# Patient Record
Sex: Female | Born: 2000 | Race: Black or African American | Hispanic: No | Marital: Single | Smoking: Never smoker
Health system: Southern US, Community
[De-identification: ages and names within clinical notes are randomized; demographics above are authoritative.]

---
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
## 2020-12-10 ENCOUNTER — Other Ambulatory Visit: Payer: Self-pay

## 2020-12-10 ENCOUNTER — Emergency Department (HOSPITAL_COMMUNITY): Payer: Medicaid Other

## 2020-12-10 ENCOUNTER — Encounter (HOSPITAL_COMMUNITY): Payer: Self-pay

## 2020-12-10 ENCOUNTER — Emergency Department (HOSPITAL_COMMUNITY)
Admission: EM | Admit: 2020-12-10 | Discharge: 2020-12-11 | Disposition: A | Discharge status: Home / Self Care | Payer: Medicaid Other | Attending: Emergency Medicine | Admitting: Emergency Medicine

## 2020-12-10 DIAGNOSIS — M25572 Pain in left ankle and joints of left foot: Secondary | ICD-10-CM | POA: Diagnosis not present

## 2020-12-10 DIAGNOSIS — R2242 Localized swelling, mass and lump, left lower limb: Secondary | ICD-10-CM | POA: Insufficient documentation

## 2020-12-10 DIAGNOSIS — M25472 Effusion, left ankle: Secondary | ICD-10-CM

## 2020-12-10 NOTE — ED Triage Notes (Signed)
Pt c/o left ankle pain starting today. Pt denies injury/trauma to site.

## 2020-12-11 MED ORDER — IBUPROFEN 200 MG PO TABS
400.0000 mg | ORAL_TABLET | Freq: Once | ORAL | Status: AC
  Administered 2020-12-11: 400 mg via ORAL
  Filled 2020-12-11: qty 2

## 2020-12-11 NOTE — ED Provider Notes (Signed)
[facility]-EMERGENCY DEPT Provider Note   CSN: [id number] Arrival date & time: 12/10/20  1841     History Chief Complaint  Patient presents with   Left Ankle Pain    Rebecca Heath is a 20 y.o. female.  The history is provided by the patient.  Ankle Pain Location:  Ankle Ankle location:  L ankle Pain details:    Quality:  Aching   Severity:  Mild   Onset quality:  Gradual   Duration:  1 day   Timing:  Constant   Progression:  Worsening Chronicity:  New Relieved by:  Rest Worsened by:  Bearing weight Associated symptoms: swelling   Associated symptoms: no fever, no muscle weakness and no numbness   Patient presents with left ankle pain and swelling over the past day. Patient does not recall a specific injury.  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  She reports over the past day the pain and swelling has worsened and it hurts to walk She has  never had this before.  No other joint pain or swelling is reported.  No chest pain or shortness of breath.  No fevers reported     PMH-none Soc hx - college student OB History   No obstetric history on file.     History reviewed. No pertinent family history.     Home Medications Prior to Admission medications   Not on File    Allergies    Patient has no known allergies.  Review of Systems   Review of Systems  Constitutional:  Negative for fever.  Respiratory:  Negative for shortness of breath.   Cardiovascular:  Negative for chest pain.  Musculoskeletal:  Positive for arthralgias and joint swelling.  All other systems reviewed and are negative.  Physical Exam Updated Vital Signs BP (!) 144/87 (BP Location: Right Arm)   Pulse 76   Temp 98.8 F (37.1 C) (Oral)   Resp 16   Ht 1.6 m (5\' 3" )   Wt 94.3 kg   LMP 12/01/2020   SpO2 100%   BMI 36.85 kg/m   Physical Exam CONSTITUTIONAL: Well developed/well nourished HEAD:  Normocephalic/atraumatic EYES: EOMI NECK: supple no meningeal signs LUNGS:  no apparent distress NEURO: Pt is awake/alert/appropriate, moves all extremitiesx4.  No facial droop.   EXTREMITIES: pulses normal/equal, full ROM Mild swelling is noted to the left foot and ankle mostly over the lateral malleolus.  There is no erythema or lacerations or abrasions.  No crepitus.  Distal pulses equal and intact.  No tenderness noted to the left distal foot or toes.  Left Achilles appears intact.  She has no other joint tenderness to her lower extremities SKIN: warm, color normal PSYCH: no abnormalities of mood noted, alert and oriented to situation  ED Results / Procedures / Treatments   Labs (all labs ordered are listed, but only abnormal results are displayed) Labs Reviewed - No data to display  EKG None  Radiology DG Ankle Complete Left  Result Date: 12/10/2020 CLINICAL DATA:  Atraumatic left ankle pain x1 day. EXAM: LEFT ANKLE COMPLETE - 3+ VIEW COMPARISON:  None. FINDINGS: There is no evidence of fracture, dislocation, or joint effusion. There is no evidence of arthropathy or other focal bone abnormality. Mild to moderate severity diffuse soft tissue swelling is seen. IMPRESSION: Mild to moderate severity diffuse soft tissue swelling without an acute osseous abnormality. Electronically Signed   By: 12/12/2020 M.D.   On: 12/10/2020  20:03    Procedures Procedures   Medications Ordered in ED Medications  ibuprofen (ADVIL) tablet 400 mg (has no administration in time range)    ED Course  I have reviewed the triage vital signs and the nursing notes.  Pertinent imaging results that were available during my care of the patient were reviewed by me and considered in my medical decision making (see chart for details).    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 Suspect this is related to her activity level. No evidence of septic joint. Overall she is well-appearing in no acute distress. Patient is requesting a walking boot.  I advised keeping leg elevated, ice and ibuprofen & to follow with orthopedics if no improvement Final Clinical Impression(s) / ED Diagnoses Final diagnoses:  Left ankle swelling    Rx / DC Orders ED Discharge Orders     None        Zadie Rhine, MD 12/11/20 0040

## 2020-12-11 NOTE — Discharge Instructions (Addendum)
When you are not walking, please keep your ankle elevated above your heart.  You can place ice on your ankle several times a day.  You can take ibuprofen 400 mg 3 times a day. If it is not improved by early next week please follow-up with orthopedic doctor

## 2021-11-10 IMAGING — CR DG ANKLE COMPLETE 3+V*L*
3 series · 3 of 3 positions shown · non-contrast
Comparison: None.

CLINICAL DATA: Atraumatic left ankle pain x1 day.

EXAM:
LEFT ANKLE COMPLETE - 3+ VIEW

[x ankle ap left]
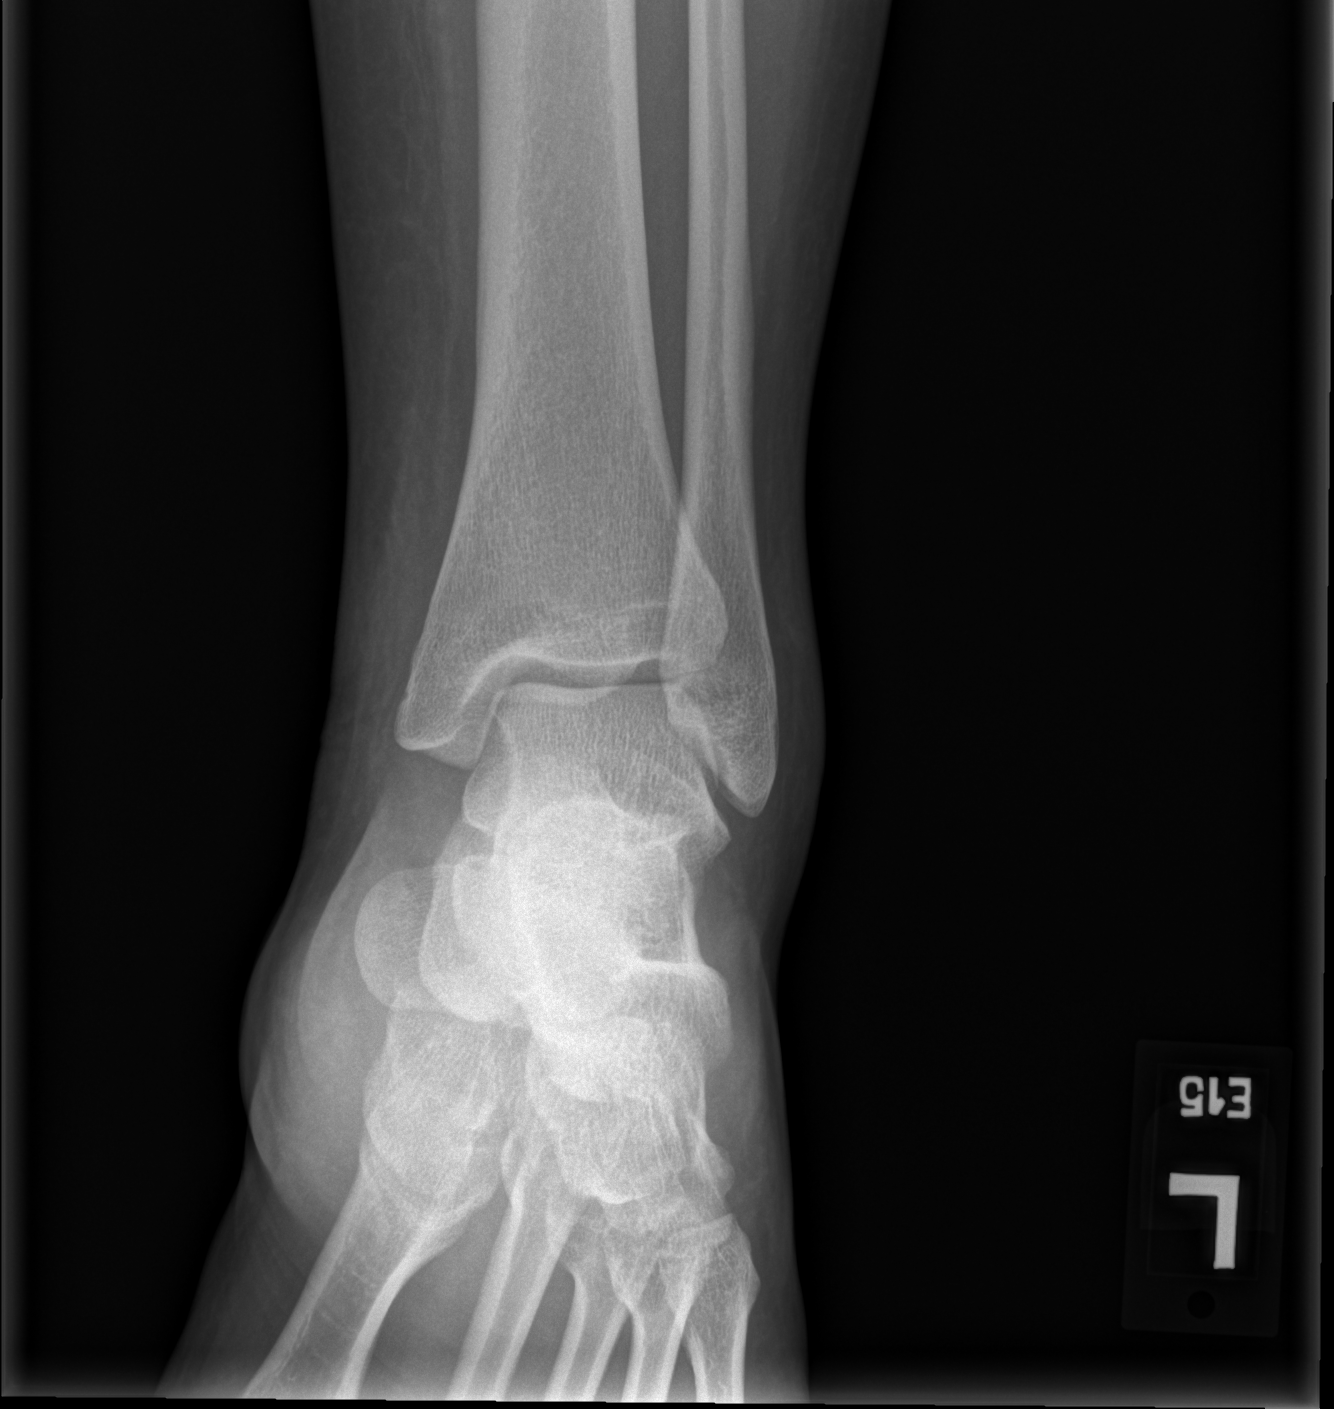

[x ankle obl left]
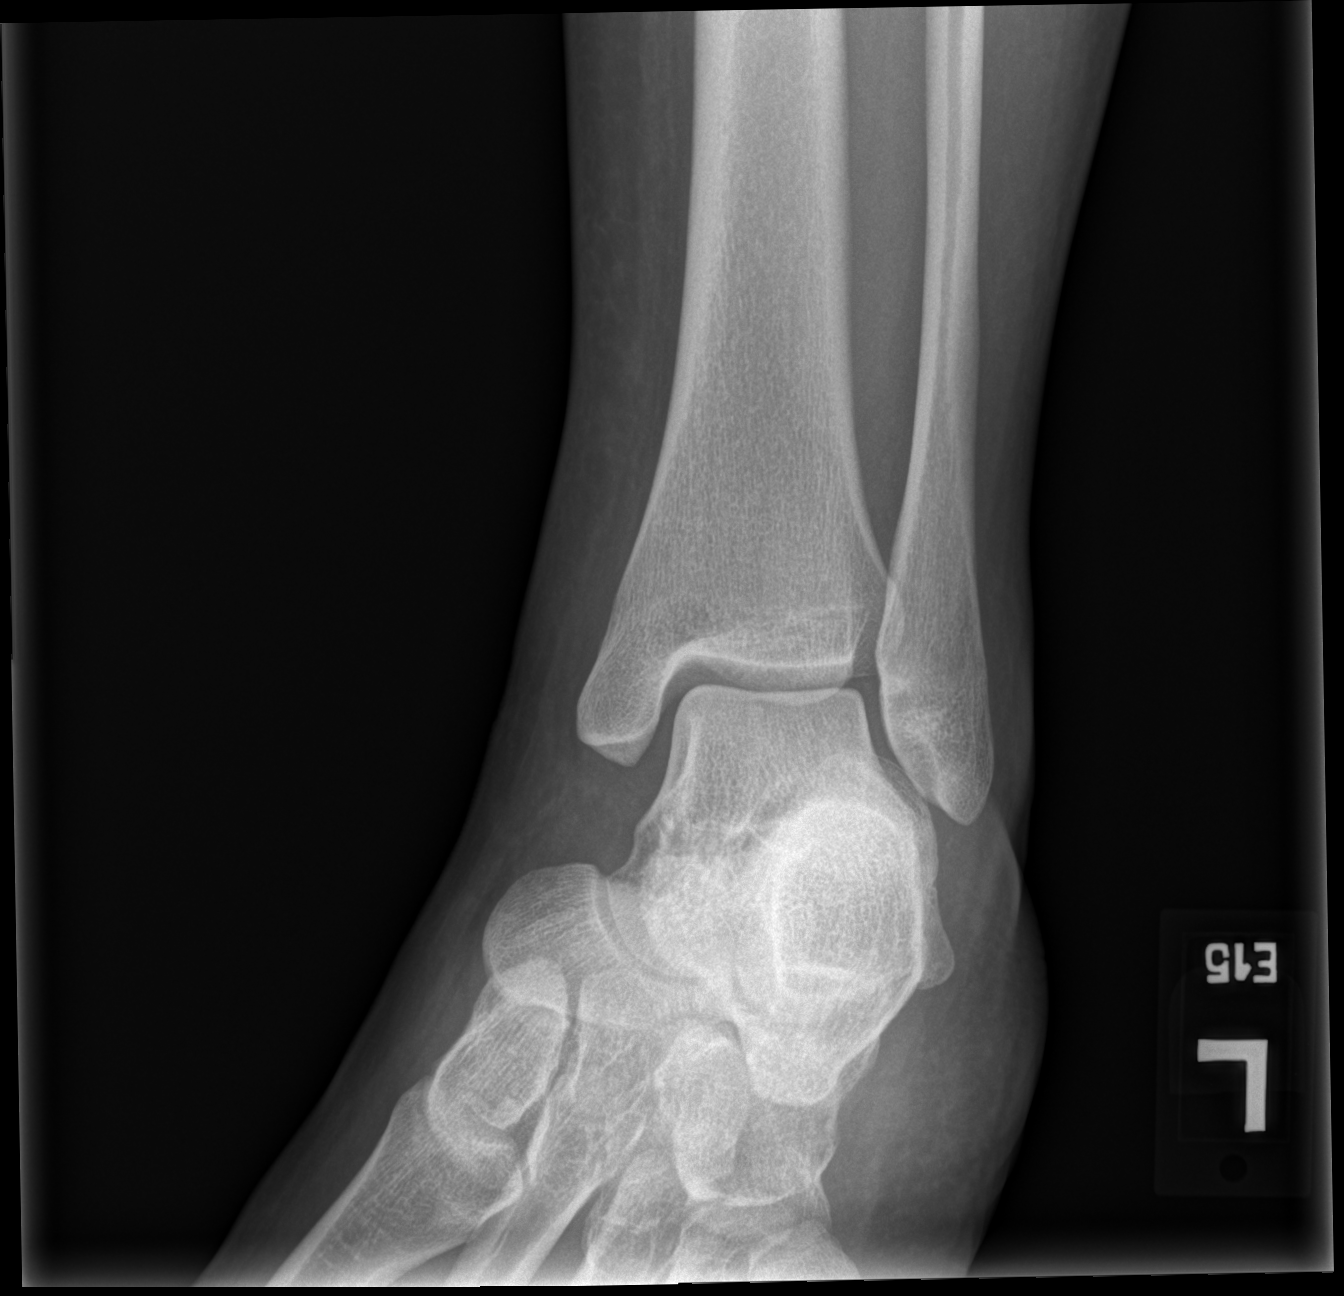

[x ankle lat left]
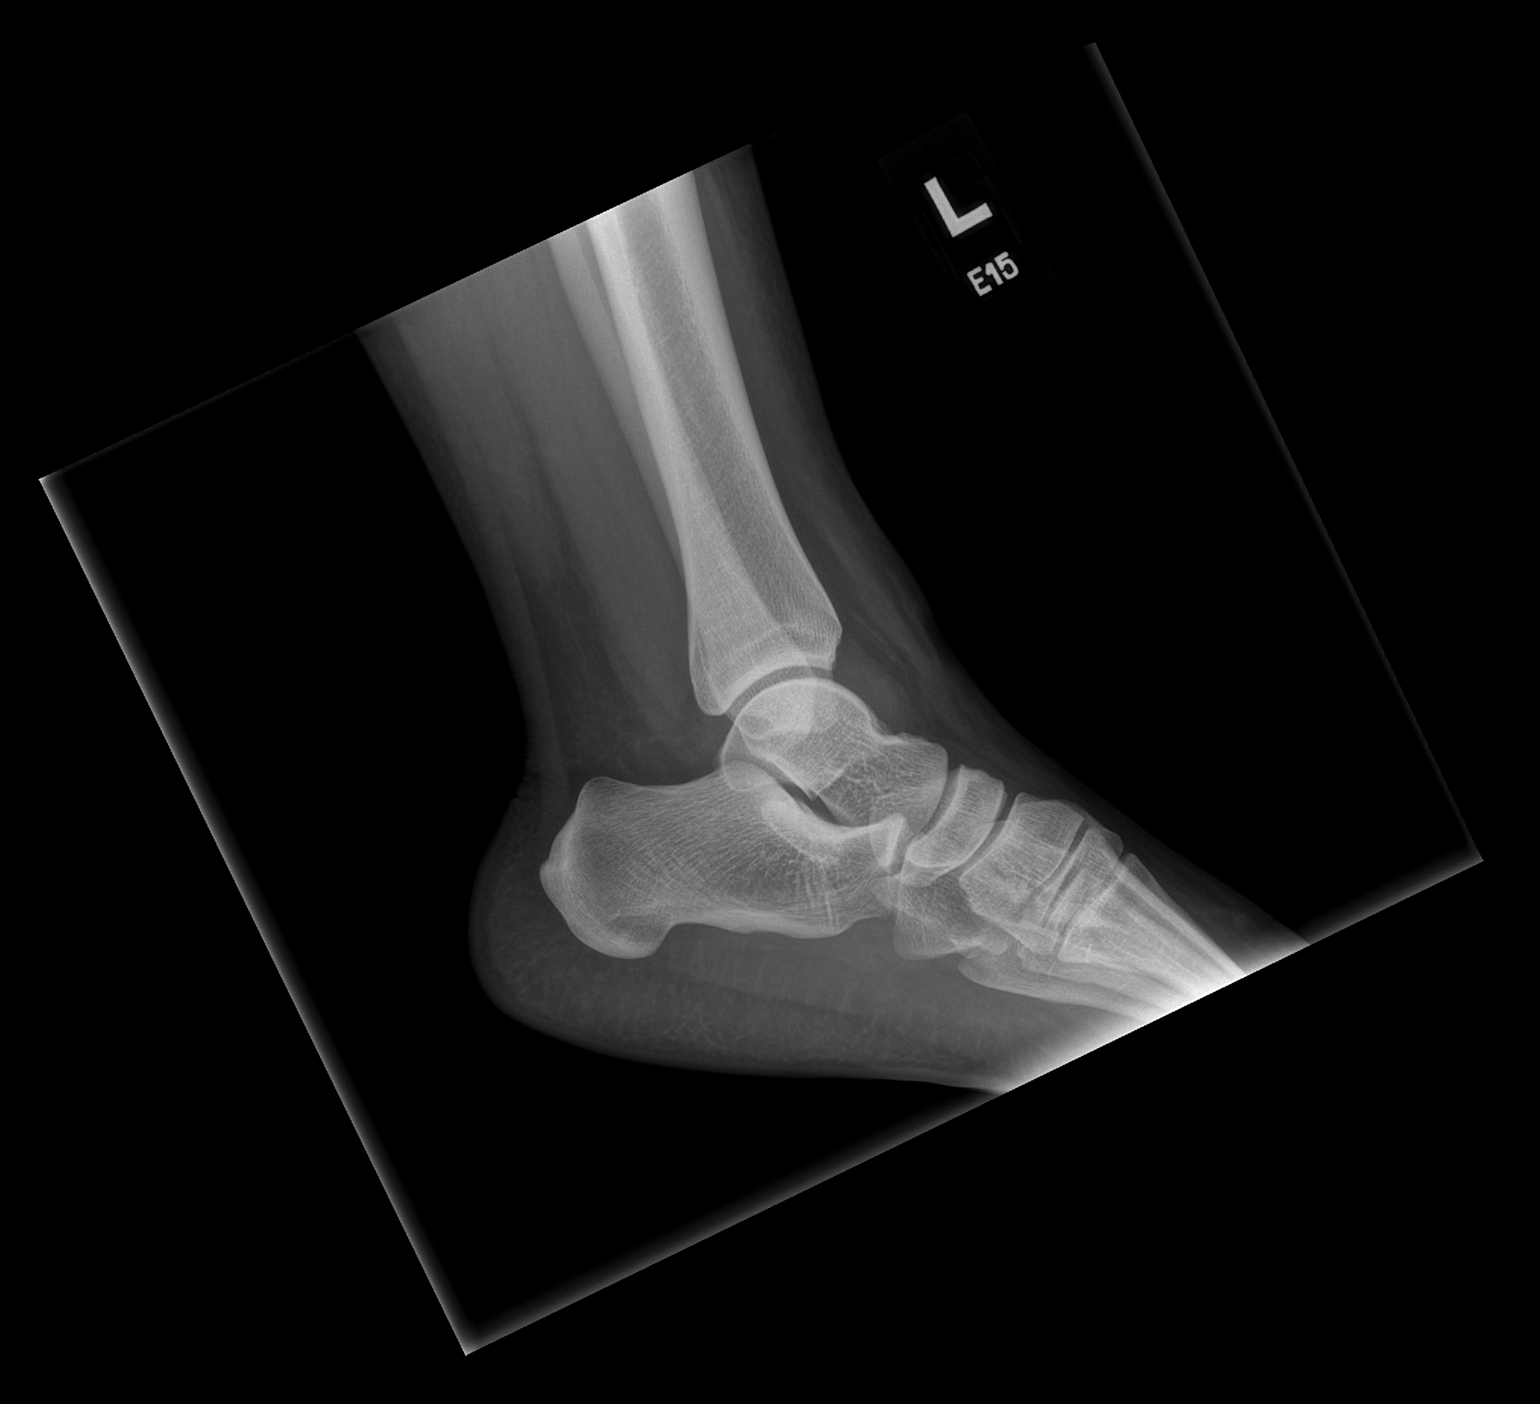

[3 of 3 positions shown; findings below may reference images not displayed]

FINDINGS: There is no evidence of fracture, dislocation, or joint effusion.
There is no evidence of arthropathy or other focal bone abnormality.
Mild to moderate severity diffuse soft tissue swelling is seen.
IMPRESSION: Mild to moderate severity diffuse soft tissue swelling without an
acute osseous abnormality.

## 2022-03-13 ENCOUNTER — Emergency Department (HOSPITAL_BASED_OUTPATIENT_CLINIC_OR_DEPARTMENT_OTHER)
Admission: EM | Admit: 2022-03-13 | Discharge: 2022-03-13 | Disposition: A | Discharge status: Home / Self Care | Payer: Medicaid Other | Attending: Emergency Medicine | Admitting: Emergency Medicine

## 2022-03-13 ENCOUNTER — Emergency Department (HOSPITAL_BASED_OUTPATIENT_CLINIC_OR_DEPARTMENT_OTHER): Payer: Medicaid Other

## 2022-03-13 ENCOUNTER — Other Ambulatory Visit: Payer: Self-pay

## 2022-03-13 ENCOUNTER — Encounter (HOSPITAL_BASED_OUTPATIENT_CLINIC_OR_DEPARTMENT_OTHER): Payer: Self-pay | Admitting: *Deleted

## 2022-03-13 DIAGNOSIS — N132 Hydronephrosis with renal and ureteral calculous obstruction: Secondary | ICD-10-CM | POA: Diagnosis not present

## 2022-03-13 DIAGNOSIS — N200 Calculus of kidney: Secondary | ICD-10-CM

## 2022-03-13 DIAGNOSIS — R1031 Right lower quadrant pain: Secondary | ICD-10-CM

## 2022-03-13 LAB — CBC
HCT: 37.7 % (ref 36.0–46.0)
Hemoglobin: 12.3 g/dL (ref 12.0–15.0)
MCH: 26 pg (ref 26.0–34.0)
MCHC: 32.6 g/dL (ref 30.0–36.0)
MCV: 79.7 fL — ABNORMAL LOW (ref 80.0–100.0)
Platelets: 303 10*3/uL (ref 150–400)
RBC: 4.73 MIL/uL (ref 3.87–5.11)
RDW: 13.5 % (ref 11.5–15.5)
WBC: 9.3 10*3/uL (ref 4.0–10.5)
nRBC: 0 % (ref 0.0–0.2)

## 2022-03-13 LAB — COMPREHENSIVE METABOLIC PANEL
ALT: 13 U/L (ref 0–44)
AST: 15 U/L (ref 15–41)
Albumin: 4.3 g/dL (ref 3.5–5.0)
Alkaline Phosphatase: 47 U/L (ref 38–126)
Anion gap: 10 (ref 5–15)
BUN: 12 mg/dL (ref 6–20)
CO2: 24 mmol/L (ref 22–32)
Calcium: 9 mg/dL (ref 8.9–10.3)
Chloride: 103 mmol/L (ref 98–111)
Creatinine, Ser: 0.85 mg/dL (ref 0.44–1.00)
GFR, Estimated: >60 mL/min (ref >60)
Glucose, Bld: 112 mg/dL — ABNORMAL HIGH (ref 70–99)
Potassium: 3.7 mmol/L (ref 3.5–5.1)
Sodium: 137 mmol/L (ref 135–145)
Total Bilirubin: 0.4 mg/dL (ref 0.3–1.2)
Total Protein: 7.7 g/dL (ref 6.5–8.1)

## 2022-03-13 LAB — PREGNANCY, URINE: Preg Test, Ur: NEGATIVE

## 2022-03-13 LAB — URINALYSIS, ROUTINE W REFLEX MICROSCOPIC
Bilirubin Urine: NEGATIVE
Glucose, UA: NEGATIVE mg/dL
Hgb urine dipstick: NEGATIVE
Ketones, ur: NEGATIVE mg/dL
Leukocytes,Ua: NEGATIVE
Nitrite: NEGATIVE
Protein, ur: 30 mg/dL — AB
Specific Gravity, Urine: 1.03 (ref 1.005–1.030)
pH: 6.5 (ref 5.0–8.0)

## 2022-03-13 LAB — LIPASE, BLOOD: Lipase: 12 U/L (ref 11–51)

## 2022-03-13 MED ORDER — TAMSULOSIN HCL 0.4 MG PO CAPS: 0.4000 mg | ORAL_CAPSULE | Freq: Every day | ORAL | 0 refills | Status: AC

## 2022-03-13 MED ORDER — OXYCODONE-ACETAMINOPHEN 5-325 MG PO TABS: 1.0000 | ORAL_TABLET | Freq: Three times a day (TID) | ORAL | 0 refills | Status: AC | PRN

## 2022-03-13 MED ORDER — IOHEXOL 300 MG/ML  SOLN
100.0000 mL | Freq: Once | INTRAMUSCULAR | Status: AC | PRN
  Administered 2022-03-13: 100 mL via INTRAVENOUS

## 2022-03-13 MED ORDER — SODIUM CHLORIDE 0.9 % IV BOLUS
1000.0000 mL | Freq: Once | INTRAVENOUS | Status: AC
  Administered 2022-03-13: 1000 mL via INTRAVENOUS

## 2022-03-13 MED ORDER — KETOROLAC TROMETHAMINE 15 MG/ML IJ SOLN
15.0000 mg | Freq: Once | INTRAMUSCULAR | Status: AC
Start: 2022-03-13 — End: 2022-03-13
  Administered 2022-03-13: 15 mg via INTRAVENOUS
  Filled 2022-03-13: qty 1

## 2022-03-13 MED ORDER — MORPHINE SULFATE (PF) 4 MG/ML IV SOLN
4.0000 mg | Freq: Once | INTRAVENOUS | Status: AC
  Administered 2022-03-13: 4 mg via INTRAVENOUS
  Filled 2022-03-13: qty 1

## 2022-03-13 MED ORDER — NAPROXEN 500 MG PO TABS: 500.0000 mg | ORAL_TABLET | Freq: Two times a day (BID) | ORAL | 0 refills | Status: AC

## 2022-03-13 MED ORDER — HYDROMORPHONE HCL 1 MG/ML IJ SOLN
1.0000 mg | Freq: Once | INTRAMUSCULAR | Status: AC
  Administered 2022-03-13: 1 mg via INTRAVENOUS
  Filled 2022-03-13: qty 1

## 2022-03-13 MED ORDER — ONDANSETRON HCL 4 MG/2ML IJ SOLN
4.0000 mg | Freq: Once | INTRAMUSCULAR | Status: AC
  Administered 2022-03-13: 4 mg via INTRAVENOUS
  Filled 2022-03-13: qty 2

## 2022-03-13 MED ORDER — ONDANSETRON 4 MG PO TBDP: 4.0000 mg | ORAL_TABLET | Freq: Three times a day (TID) | ORAL | 0 refills | Status: AC | PRN

## 2022-03-13 NOTE — ED Provider Notes (Signed)
[facility] Provider Note   CSN: [id number] Arrival date & time: 03/13/22  1621     History  Chief Complaint  Patient presents with  . Abdominal Pain    Rebecca Heath is a 21 y.o. female presenting to the ED with sudden onset of right lower quadrant pain.  Patient states she was at work around noon when she started noticing some increasing pain.  No hx prior abdominal surgeries.  Unknown LMP, denies possibility of pregnancy.  Pain described as sharp, 10/10, with mild waxing and waning that appears to occur at random.  Also with nausea and nonbloody nonbilious vomiting earlier today, however has improved since arriving to ED.  Reports feeling dehydrated.  Denies urinary symptoms, though with 1-2 episodes of mild diarrhea prior to onset.  No other complaints at this time.  Accompanied with family member.  The history is provided by the patient and medical records.  Abdominal Pain    Home Medications Prior to Admission medications   Medication Sig Start Date End Date Taking? Authorizing Provider  naproxen (NAPROSYN) 500 MG tablet Take 1 tablet (500 mg total) by mouth 2 (two) times daily. 03/13/22  Yes Cecil Cobbsockerham, Yannely Kintzel M, PA-C  ondansetron (ZOFRAN-ODT) 4 MG disintegrating tablet Take 1 tablet (4 mg total) by mouth every 8 (eight) hours as needed for nausea or vomiting. 03/13/22  Yes Cecil Cobbsockerham, Debbra Digiulio M, PA-C  oxyCODONE-acetaminophen (PERCOCET/ROXICET) 5-325 MG tablet Take 1 tablet by mouth every 8 (eight) hours as needed for severe pain. 03/13/22  Yes Cecil Cobbsockerham, Mirren Gest M, PA-C  tamsulosin (FLOMAX) 0.4 MG CAPS capsule Take 1 capsule (0.4 mg total) by mouth daily for 7 days. 03/13/22 03/20/22 Yes Cecil Cobbsockerham, Symir Mah M, PA-C      Allergies    Patient has no known allergies.    Review of Systems   Review of Systems  Gastrointestinal:  Positive for abdominal pain.    Physical Exam Updated Vital Signs BP (!) 163/102   Pulse 64   Temp 98.1 F (36.7 C) (Oral)    Resp 18   Wt 86.2 kg   LMP 03/06/2022 (Exact Date)   SpO2 99%   BMI 33.66 kg/m  Physical Exam Vitals and nursing note reviewed.  Constitutional:      General: She is not in acute distress.    Appearance: She is well-developed. She is not ill-appearing, toxic-appearing or diaphoretic.     Comments: Uncomfortable appearing  HENT:     Head: Normocephalic and atraumatic.     Mouth/Throat:     Pharynx: Oropharynx is clear.  Eyes:     General: No scleral icterus.    Conjunctiva/sclera: Conjunctivae normal.  Cardiovascular:     Rate and Rhythm: Normal rate and regular rhythm.     Heart sounds: No murmur heard. Pulmonary:     Effort: Pulmonary effort is normal. No respiratory distress.     Breath sounds: Normal breath sounds.  Chest:     Chest wall: No tenderness.  Abdominal:     General: Abdomen is protuberant. There is no distension.     Palpations: Abdomen is soft. There is no shifting dullness.     Tenderness: There is abdominal tenderness (Right sided with radiation to right middle back). There is no right CVA tenderness, left CVA tenderness or guarding. Negative signs include Murphy's sign and McBurney's sign.  Musculoskeletal:        General: No swelling.     Cervical back: Neck supple.  Skin:    General: Skin is  warm and dry.     Capillary Refill: Capillary refill takes less than 2 seconds.     Coloration: Skin is not cyanotic, jaundiced or pale.  Neurological:     Mental Status: She is alert and oriented to person, place, and time.  Psychiatric:        Mood and Affect: Mood is anxious.     ED Results / Procedures / Treatments   Labs (all labs ordered are listed, but only abnormal results are displayed) Labs Reviewed  COMPREHENSIVE METABOLIC PANEL - Abnormal; Notable for the following components:      Result Value   Glucose, Bld 112 (*)    All other components within normal limits  CBC - Abnormal; Notable for the following components:   MCV 79.7 (*)    All  other components within normal limits  URINALYSIS, ROUTINE W REFLEX MICROSCOPIC - Abnormal; Notable for the following components:   Protein, ur 30 (*)    All other components within normal limits  LIPASE, BLOOD  PREGNANCY, URINE    EKG None  Radiology CT Abdomen Pelvis W Contrast  Result Date: 03/13/2022 CLINICAL DATA:  Right lower quadrant pain. EXAM: CT ABDOMEN AND PELVIS WITH CONTRAST TECHNIQUE: Multidetector CT imaging of the abdomen and pelvis was performed using the standard protocol following bolus administration of intravenous contrast. RADIATION DOSE REDUCTION: This exam was performed according to the departmental dose-optimization program which includes automated exposure control, adjustment of the mA and/or kV according to patient size and/or use of iterative reconstruction technique. CONTRAST:  OMNIPAQUE IOHEXOL 300 MG/ML  SOLN COMPARISON:  None Available. FINDINGS: Lower chest: No acute abnormality. Hepatobiliary: No focal liver abnormality is seen. No gallstones, gallbladder wall thickening, or biliary dilatation. Pancreas: Unremarkable. No pancreatic ductal dilatation or surrounding inflammatory changes. Spleen: Normal in size without focal abnormality. Adrenals/Urinary Tract: Adrenal glands are unremarkable. Kidneys are normal in size, without focal lesions. A 2 mm obstructing renal calculus is seen within the distal right ureter, with mild right-sided hydronephrosis, hydroureter and perinephric inflammatory fat stranding. Delayed renal cortical enhancement is also seen on the right. Bladder is unremarkable. Stomach/Bowel: Stomach is within normal limits. Appendix appears normal. No evidence of bowel wall thickening, distention, or inflammatory changes. Vascular/Lymphatic: No significant vascular findings are present. No enlarged abdominal or pelvic lymph nodes. Reproductive: Uterus and bilateral adnexa are unremarkable. Other: No abdominal wall hernia or abnormality. No  abdominopelvic ascites. Musculoskeletal: No acute or significant osseous findings. IMPRESSION: 2 mm obstructing renal calculus within the distal right ureter. Electronically Signed   By: Aram Candela M.D.   On: 03/13/2022 20:39    Procedures Procedures    Medications Ordered in ED Medications  sodium chloride 0.9 % bolus 1,000 mL (0 mLs Intravenous Stopped 03/13/22 2131)  morphine (PF) 4 MG/ML injection 4 mg (4 mg Intravenous Given 03/13/22 2002)  ondansetron (ZOFRAN) injection 4 mg (4 mg Intravenous Given 03/13/22 2001)  iohexol (OMNIPAQUE) 300 MG/ML solution 100 mL (100 mLs Intravenous Contrast Given 03/13/22 2022)  ketorolac (TORADOL) 15 MG/ML injection 15 mg (15 mg Intravenous Given 03/13/22 2048)  sodium chloride 0.9 % bolus 1,000 mL (1,000 mLs Intravenous New Bag/Given 03/13/22 2133)  HYDROmorphone (DILAUDID) injection 1 mg (1 mg Intravenous Given 03/13/22 2132)    ED Course/ Medical Decision Making/ A&P Clinical Course as of 03/13/22 2339  Sat Mar 13, 2022  2043 CT Abdomen Pelvis W Contrast 55mm obstructing stone right distal ureter with mild right-sided hydronephrosis, hydroureter and perinephric inflammatory fat stranding [AC]  Clinical Course User Index [AC] Cecil Cobbs, PA-C                           Medical Decision Making Amount and/or Complexity of Data Reviewed Labs: ordered. Radiology: ordered. Decision-making details documented in ED Course.  Risk Prescription drug management.   21 y.o. female presents to the ED for concern of Abdominal Pain   This involves an extensive number of treatment options, and is a complaint that carries with it a high risk of complications and morbidity.  The emergent differential diagnosis prior to evaluation includes, but is not limited to: Appendicitis, renal calculi, bowel obstruction, bowel perforation, ovarian torsion  This is not an exhaustive differential.   Past Medical History / Co-morbidities / Social History: No  PMHx. Social Determinants of Health include: None  Additional History:  None  Lab Tests: I ordered, and personally interpreted labs.  The pertinent results include:   Pregnancy negative CMP without evidence of electrolyte derangement, AKI, or abnormal LFTs UA without evidence of infection or hematuria Lipase 12 CBC without elevated WBC or evidence of anemia  Imaging Studies: I ordered imaging studies including CT abdomen and pelvis.   I independently visualized and interpreted imaging which showed 2 mm obstructing stone of the right ureter, just proximal to the bladder opening.  With mild right-sided hydronephrosis, hydroureter and perinephric inflammatory fat stranding. I agree with the radiologist interpretation.  ED Course: Pt overall well though mildly uncomfortable-appearing on exam.  Nontoxic, nonseptic appearing in NAD.  Afebrile.  Presenting to the ED with right-sided abdominal pain, quick onset at around noon today.  With nausea and vomiting.  Without urinary or vaginal symptoms.  Patient is nontoxic, nonseptic appearing, in no apparent distress.  Patient's pain and other symptoms adequately managed in ED.  Fluid bolus, toradol, and zofran given.  Labs, history and vitals reviewed.  Negative pregnancy.  Patient does not meet the SIRS or Sepsis criteria.  Plan for CT imaging and reassess.  If CT imaging unremarkable without clear cause, plan to proceed with pelvic ultrasound imaging to assess for ovarian torsion. CT imaging indicative of 2 mm right ureteral calculus with mild hydronephrosis and without significant complications. On repeat exam patient does not have a surgical abdomen and there are no peritoneal signs.  Mild improvement appreciated with second round of pain medication.  Patient afebrile, without urinary symptoms or AKI, without elevated white count or leukocytosis, and imaging not supportive of pyelonephritis.  Recommend close follow up with Urology for re-evaluation and  continued management, resources provided.  Prescriptions for anti-inflammatory, pain management, Flomax, and Zofran sent to pharmacy.  Strict return precautions discussed.  Patient reports satisfaction with today's encounter.  Patient in NAD and in good condition at time of discharge.  Disposition: After consideration the patient's encounter today, I do not feel today's workup suggests an emergent condition requiring admission or immediate intervention beyond what has been performed at this time.  Safe for discharge; instructed to return immediately for worsening symptoms, change in symptoms or any other concerns.  I have reviewed the patients home medicines and have made adjustments as needed.  Discussed course of treatment with the patient, whom demonstrated understanding.  Patient in agreement and has no further questions.    I discussed this case with my attending physician Dr. Adela Lank, who agreed with the proposed treatment course and cosigned this note.  Attending physician stated agreement with plan or made changes to plan  which were implemented.     This chart was dictated using voice recognition software.  Despite best efforts to proofread, errors can occur which can change the documentation meaning.          Final Clinical Impression(s) / ED Diagnoses Final diagnoses:  Renal calculus, right  Right lower quadrant abdominal pain    Rx / DC Orders ED Discharge Orders          Ordered    oxyCODONE-acetaminophen (PERCOCET/ROXICET) 5-325 MG tablet  Every 8 hours PRN        03/13/22 2313    naproxen (NAPROSYN) 500 MG tablet  2 times daily        03/13/22 2313    tamsulosin (FLOMAX) 0.4 MG CAPS capsule  Daily        03/13/22 2313    ondansetron (ZOFRAN-ODT) 4 MG disintegrating tablet  Every 8 hours PRN        03/13/22 2313              Cecil Cobbs, PA-C 03/13/22 2339    Melene Plan, DO 03/14/22 1456

## 2022-03-13 NOTE — Discharge Instructions (Addendum)
You were evaluated in the emergency department today for right-sided abdominal pain.  Your workup today indicated a 2 mm kidney stone in the right ureter on its way to the bladder.  There was some mild fluid backup, however there was not evidence of UTI, infection, or other complication.  Is recommended to continue staying hydrated, and get plenty of rest.  Stay on top of your medications as prescribed, and continue to keep up with adequate nutrition.  Further information regarding kidney stones, renal colic, and dietary guidelines have been provided for you to review.  Several prescriptions have been called in your pharmacy.  Naproxen is an anti-inflammatory, take once every 12 hours.  Percocet is a strong pain medication, take 1 tablet as needed every 8 hours for pain.  Take the Zofran at the same time to reduce nausea that may be associated with pain medicine and or your pain.  The Zofran is a dissolvable tablet that you placed under your tongue.  The last medication is called Flomax, this increases the water output from your kidneys.  Take this once daily for the next week.  The contact information for a local urologist has been provided for you by the name of Dr. Liliane Shi.  Please call first thing Monday morning to schedule your follow-up within the next 1 to 2 days.  Return to the ED for new or worsening symptoms as discussed.

## 2022-03-13 NOTE — ED Notes (Signed)
Additional urine brought to lab as initial sample was very small

## 2022-03-13 NOTE — ED Triage Notes (Addendum)
Pt is here for evaluation of abdominal pain which began today and is in right lower quadrant.  Pt has had nausea and vomiting with this. Pt also reports that she has had some diarrhea while vomiting.  Pt was seen at [facility] but had no labs, she was sent here for further evaluation and concern about rule out appendicitis.

## 2023-03-16 ENCOUNTER — Ambulatory Visit: Payer: Medicaid Other | Admitting: Plastic Surgery

## 2023-03-16 ENCOUNTER — Encounter: Payer: Self-pay | Admitting: Plastic Surgery

## 2023-03-16 VITALS — BP 119/85 | HR 67 | Ht 64.0 in | Wt 183.4 lb

## 2023-03-16 DIAGNOSIS — M546 Pain in thoracic spine: Secondary | ICD-10-CM

## 2023-03-16 DIAGNOSIS — M542 Cervicalgia: Secondary | ICD-10-CM

## 2023-03-16 DIAGNOSIS — N62 Hypertrophy of breast: Secondary | ICD-10-CM | POA: Diagnosis not present

## 2023-03-16 NOTE — Progress Notes (Signed)
[facility], [facility] [address] [place],  [place] [address]   CC:  Chief Complaint  Patient presents with   Advice Only      Rebecca Heath is an 22 y.o. female.  HPI: [name] is a 22 year old female who presents today with complaints of upper back and neck pain as well as lower back and knee pain when she dances which she attributes to the large size of her breast.  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  Finds that she has increasing difficulty with activities such as holding planks due to the large size of her breast.  She also finds it difficult to find bras which adequately support her.  She is interested in a breast reduction.  No Known Allergies  Outpatient Encounter Medications as of 03/16/2023  Medication Sig   cetirizine (ZYRTEC) 10 MG tablet Take by mouth.   naproxen (NAPROSYN) 500 MG tablet Take 1 tablet (500 mg total) by mouth 2 (two) times daily. (Patient not taking: Reported on 03/16/2023)   ondansetron (ZOFRAN-ODT) 4 MG disintegrating tablet Take 1 tablet (4 mg total) by mouth every 8 (eight) hours as needed for nausea or vomiting. (Patient not taking: Reported on 03/16/2023)   oxyCODONE-acetaminophen (PERCOCET/ROXICET) 5-325 MG tablet Take 1 tablet by mouth every 8 (eight) hours as needed for severe pain. (Patient not taking: Reported on 03/16/2023)   No facility-administered encounter medications on file as of 03/16/2023.     No past medical history on file.  No past surgical history on file.  No family history on file.  Social History   Social History Narrative   Not on file     Review of Systems General: Denies fevers, chills, weight loss CV: Denies chest pain, shortness of breath, palpitations Breast: Large pendulous breasts which the patient feels impede her  daily activities and cause upper back and neck pain.  Physical Exam    03/16/2023   10:04 AM 03/13/2022   10:30 PM 03/13/2022   10:00 PM  Vitals with BMI  Height 5\' 4"     Weight 183 lbs 6 oz    BMI 31.47    Systolic 119 163 409  Diastolic 85 102 100  Pulse 67 64 69    General:  No acute distress,  Alert and oriented, Non-Toxic, Normal speech and affect Breast: Patient has large pendulous breast with grade 2 ptosis.  On physical exam she has very fibro glandular and dense breasts bilaterally.  There is some prominence of the fibroglandular elements on the right breast.  There is no evidence of nipple discharge and the nipples are normal in appearance.  Sternal notch to nipple distance on the right is 31 cm and 33 cm on the left fold to nipple distance on the right is 19 cm and 17 cm on the left Mammogram: Not applicable due to age Assessment/Plan Macromastia: This is a 22 year old female who has had increasing difficulty with upper back pain and interference with her daily activities over the past year or so.  I believe that she would benefit from a bilateral breast reduction.  I believe that I can remove 600 g per breast.  I discussed the procedure at length with the patient including discussing the location of the incisions and the unpredictable nature of scarring and wound  healing.  We discussed the risk of bleeding, infection, and seroma formation.  She understands I will use drains postoperatively.  We discussed the risk of nipple loss due to nipple ischemia.  We discussed the postoperative restrictions including no heavy lifting greater than 20 pounds, no vigorous activity, no submerging the incisions in water for 6 weeks.  We discussed the postoperative risk of DVT and the importance of early ambulation.  Patient understands that she does have some asymmetry between the breast and this asymmetry will likely persist postoperatively.  All questions were answered today to her satisfaction.   Photographs were obtained today with her consent.  Will submit her for bilateral breast reduction at her request.  Due to the prominence of the fibroglandular elements on the right breast we will also obtain an ultrasound to be performed prior to surgery.  Santiago Glad 03/16/2023, 10:24 AM

## 2023-03-31 NOTE — Addendum Note (Signed)
Addended by: Weyman Croon on: 03/31/2023 10:24 AM   Modules accepted: Orders

## 2023-04-04 ENCOUNTER — Other Ambulatory Visit: Payer: Medicaid Other

## 2023-04-04 ENCOUNTER — Ambulatory Visit
Admission: RE | Admit: 2023-04-04 | Discharge: 2023-04-04 | Disposition: A | Discharge status: Home / Self Care | Payer: Medicaid Other | Source: Ambulatory Visit | Attending: Plastic Surgery | Admitting: Plastic Surgery

## 2023-04-04 DIAGNOSIS — N62 Hypertrophy of breast: Secondary | ICD-10-CM

## 2023-11-14 ENCOUNTER — Ambulatory Visit: Admitting: Plastic Surgery

## 2023-11-14 VITALS — BP 130/83 | HR 67 | Ht 64.0 in | Wt 162.2 lb

## 2023-11-14 DIAGNOSIS — N62 Hypertrophy of breast: Secondary | ICD-10-CM | POA: Diagnosis not present

## 2023-11-14 NOTE — Progress Notes (Signed)
 Rebecca Heath follows up today after ultrasound in anticipation of scheduling her breast reduction.  The ultrasound was unremarkable with normal-appearing architecture and no dominant masses.  Was read as BI-RADS 1 with recommendations for routine breast cancer screening at the appropriate time at age 23.  She may be scheduled for her breast reduction anytime.
